# Patient Record
Sex: Female | Born: 2000 | Race: Black or African American | Hispanic: No | Marital: Single | Smoking: Never smoker
Health system: Southern US, Community
[De-identification: ages and names within clinical notes are randomized; demographics above are authoritative.]

---
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
## 2021-03-29 DIAGNOSIS — F419 Anxiety disorder, unspecified: Secondary | ICD-10-CM | POA: Diagnosis not present

## 2021-12-06 DIAGNOSIS — M25562 Pain in left knee: Secondary | ICD-10-CM | POA: Diagnosis not present

## 2022-03-10 DIAGNOSIS — J0110 Acute frontal sinusitis, unspecified: Secondary | ICD-10-CM | POA: Diagnosis not present

## 2022-03-26 ENCOUNTER — Ambulatory Visit (HOSPITAL_COMMUNITY)
Admission: EM | Admit: 2022-03-26 | Discharge: 2022-03-26 | Disposition: A | Discharge status: Home / Self Care | Payer: BC Managed Care – PPO | Attending: Internal Medicine | Admitting: Internal Medicine

## 2022-03-26 ENCOUNTER — Encounter (HOSPITAL_COMMUNITY): Payer: Self-pay

## 2022-03-26 DIAGNOSIS — J069 Acute upper respiratory infection, unspecified: Secondary | ICD-10-CM

## 2022-03-26 DIAGNOSIS — Z1152 Encounter for screening for COVID-19: Secondary | ICD-10-CM | POA: Diagnosis not present

## 2022-03-26 DIAGNOSIS — J029 Acute pharyngitis, unspecified: Secondary | ICD-10-CM

## 2022-03-26 LAB — POCT RAPID STREP A, ED / UC: Streptococcus, Group A Screen (Direct): NEGATIVE

## 2022-03-26 NOTE — Discharge Instructions (Signed)
COVID/flu test will be completed in 24-48 hours.  If you do not get a call from this office within that timeframe it indicates the test is negative.  Log onto MyChart to review the results of the test when it post in 24-48 hours. Advised to continue using OTC cough preparations for the congestion and cough. Advise use Tylenol and ibuprofen for aches or pains. Advised to follow-up PCP or return to urgent care if symptoms fail to improve.

## 2022-03-26 NOTE — ED Triage Notes (Signed)
Cough, fever sore throat for 5 days. Patient's mom was sick first. No covid or flu testing.

## 2022-03-26 NOTE — ED Provider Notes (Signed)
[facility]    CSN: [id number] Arrival date & time: 03/26/22  1625      History   Chief Complaint Chief Complaint  Patient presents with   Sore Throat   Fever   Cough    HPI VARIE MACHAMER is a 21 y.o. female.   21 year old female presents with cough congestion.  Patient indicates for the past 5 days that she has been having progressive sore throat and painful swallowing.  She indicates that she has had upper respiratory congestion with rhinitis and postnasal drip and mainly clear production.  She does indicates she just finished a course of amoxicillin for a sinus infection.  She indicates she is having some chest congestion and cough with clear to yellow-greenish production.  She denies having fever or chills.  She indicates she was around her 2 nieces who are having similar type symptoms, the older niece had a negative flu/COVID/RSV test.  Patient relates that she is tolerating fluids well and that she is not having any fever, chills, body aches or pains, nausea or vomiting.   Sore Throat  Fever Associated symptoms: cough   Cough Associated symptoms: fever     History reviewed. No pertinent past medical history.  There are no problems to display for this patient.   History reviewed. No pertinent surgical history.  OB History   No obstetric history on file.      Home Medications    Prior to Admission medications   Not on File    Family History History reviewed. No pertinent family history.  Social History Social History   Tobacco Use   Smoking status: Never   Smokeless tobacco: Never  Vaping Use   Vaping Use: Never used  Substance Use Topics   Alcohol use: Yes   Drug use: Never     Allergies   Tylenol [acetaminophen]   Review of Systems Review of Systems  Constitutional:  Positive for fever.  Respiratory:  Positive for cough.      Physical Exam Triage Vital Signs ED Triage Vitals  Enc Vitals Group     BP 03/26/22 1645  119/79     Pulse Rate 03/26/22 1645 82     Resp 03/26/22 1645 18     Temp 03/26/22 1645 98.9 F (37.2 C)     Temp Source 03/26/22 1645 Oral     SpO2 03/26/22 1645 98 %     Weight --      Height --      Head Circumference --      Peak Flow --      Pain Score 03/26/22 1640 7     Pain Loc --      Pain Edu? --      Excl. in [place]? --    No data found.  Updated Vital Signs BP 119/79 (BP Location: Left Arm)   Pulse 82   Temp 98.9 F (37.2 C) (Oral)   Resp 18   LMP 02/11/2022 (Approximate)   SpO2 98%   Visual Acuity Right Eye Distance:   Left Eye Distance:   Bilateral Distance:    Right Eye Near:   Left Eye Near:    Bilateral Near:     Physical Exam Constitutional:      Appearance: She is well-developed.  HENT:     Right Ear: Tympanic membrane and ear canal normal.     Left Ear: Tympanic membrane and ear canal normal.     Mouth/Throat:     Mouth:  Mucous membranes are moist.     Pharynx: Posterior oropharyngeal erythema present. No oropharyngeal exudate.  Cardiovascular:     Rate and Rhythm: Normal rate and regular rhythm.     Heart sounds: Normal heart sounds.  Pulmonary:     Effort: Pulmonary effort is normal.     Breath sounds: Normal air entry. Decreased breath sounds present. No wheezing, rhonchi or rales.  Lymphadenopathy:     Cervical: No cervical adenopathy.  Neurological:     Mental Status: She is alert.      UC Treatments / Results  Labs (all labs ordered are listed, but only abnormal results are displayed) Labs Reviewed  RESP PANEL BY RT-PCR (FLU A&B, COVID) [id number]  CULTURE, GROUP A STREP [facility])  POCT RAPID STREP A, ED / UC    EKG   Radiology No results found.  Procedures Procedures (including critical care time)  Medications Ordered in UC Medications - No data to display  Initial Impression / Assessment and Plan / UC Course  I have reviewed the triage vital signs and the nursing notes.  Pertinent labs & imaging results that were  available during my care of the patient were reviewed by me and considered in my medical decision making (see chart for details).    Plan: 1.  The pharyngitis will be treated with the following: A.  Throat culture is pending. B.  Advised to use Tylenol or ibuprofen for discomfort along with salt water water gargles and lozenges. 2.  The upper respiratory tract infection will be treated with the following: A.  Patient advised to use OTC respiratory cough preparations to control symptoms as this is a viral process and will likely resolve within another 3 to 5 days. 3.  COVID screening will be treated with the following: A.  COVID test is pending due to upper respiratory symptoms with cough, congestion, and sore throat to ensure that COVID is not present. 4.  Patient advised to follow-up PCP or return to urgent care if symptoms fail to improve. Final Clinical Impressions(s) / UC Diagnoses   Final diagnoses:  Viral upper respiratory tract infection  Pharyngitis, unspecified etiology  Encounter for screening for COVID-19     Discharge Instructions      COVID/flu test will be completed in 24-48 hours.  If you do not get a call from this office within that timeframe it indicates the test is negative.  Log onto MyChart to review the results of the test when it post in 24-48 hours. Advised to continue using OTC cough preparations for the congestion and cough. Advise use Tylenol and ibuprofen for aches or pains. Advised to follow-up PCP or return to urgent care if symptoms fail to improve.    ED Prescriptions   None    PDMP not reviewed this encounter.   Ellsworth Lennox, PA-C 03/26/22 1732

## 2022-03-27 LAB — RESP PANEL BY RT-PCR (FLU A&B, COVID) ARPGX2
Influenza A by PCR: NEGATIVE
Influenza B by PCR: NEGATIVE
SARS Coronavirus 2 by RT PCR: NEGATIVE

## 2022-03-29 LAB — CULTURE, GROUP A STREP (THRC)

## 2022-06-20 ENCOUNTER — Ambulatory Visit
Admission: EM | Admit: 2022-06-20 | Discharge: 2022-06-20 | Disposition: A | Discharge status: Home / Self Care | Payer: BC Managed Care – PPO | Attending: Internal Medicine | Admitting: Internal Medicine

## 2022-06-20 DIAGNOSIS — Z1152 Encounter for screening for COVID-19: Secondary | ICD-10-CM | POA: Insufficient documentation

## 2022-06-20 DIAGNOSIS — R058 Other specified cough: Secondary | ICD-10-CM | POA: Diagnosis not present

## 2022-06-20 DIAGNOSIS — R051 Acute cough: Secondary | ICD-10-CM | POA: Diagnosis present

## 2022-06-20 DIAGNOSIS — B349 Viral infection, unspecified: Secondary | ICD-10-CM | POA: Diagnosis present

## 2022-06-20 DIAGNOSIS — R0989 Other specified symptoms and signs involving the circulatory and respiratory systems: Secondary | ICD-10-CM | POA: Diagnosis not present

## 2022-06-20 DIAGNOSIS — R42 Dizziness and giddiness: Secondary | ICD-10-CM | POA: Diagnosis not present

## 2022-06-20 DIAGNOSIS — H6593 Unspecified nonsuppurative otitis media, bilateral: Secondary | ICD-10-CM

## 2022-06-20 LAB — POCT FASTING CBG KUC MANUAL ENTRY: POCT Glucose (KUC): 99 mg/dL (ref 70–99)

## 2022-06-20 MED ORDER — PREDNISONE 20 MG PO TABS: 40.0000 mg | ORAL_TABLET | Freq: Every day | ORAL | 0 refills | Status: AC

## 2022-06-20 NOTE — ED Triage Notes (Signed)
Pt c/o temporary dizziness when waking up this morning. States throughout the day the dizziness has been getting worse and she cannot walk straight, she runs into walls. Also c/o cough.

## 2022-06-20 NOTE — Discharge Instructions (Signed)
You have fluid behind your eardrums which is most likely causing your dizziness.  I have prescribed prednisone to help alleviate this and cough.  You most likely have a viral illness causing the symptoms.  COVID test is pending.  Will call if it is positive.  I recommend that you go straight to the emergency department if your dizziness does not improve or if it worsens in the next 24 to 48 hours.

## 2022-06-20 NOTE — ED Provider Notes (Signed)
[facility]    CSN: [id number] Arrival date & time: 06/20/22  1740      History   Chief Complaint Chief Complaint  Patient presents with   Dizziness    HPI Brandi Peterson is a 22 y.o. female.   Patient presents with dizziness, cough, runny nose that started today.  Cough is nonproductive per patient.  She denies any known fevers at home.  Denies chest pain, shortness of breath, palpitations, blurred vision, headache, nausea, vomiting, diarrhea, abdominal pain.  Reports known sick contact with similar symptoms.  She does not report taking medications to help alleviate pain.  Denies history of asthma.  Denies any recent falls or head trauma.   Dizziness   History reviewed. No pertinent past medical history.  There are no problems to display for this patient.   History reviewed. No pertinent surgical history.  OB History   No obstetric history on file.      Home Medications    Prior to Admission medications   Medication Sig Start Date End Date Taking? Authorizing Provider  predniSONE (DELTASONE) 20 MG tablet Take 2 tablets (40 mg total) by mouth daily for 5 days. 06/20/22 06/25/22 Yes Gustavus Bryant, FNP    Family History History reviewed. No pertinent family history.  Social History Social History   Tobacco Use   Smoking status: Never   Smokeless tobacco: Never  Vaping Use   Vaping Use: Never used  Substance Use Topics   Alcohol use: Yes   Drug use: Never     Allergies   Tylenol [acetaminophen]   Review of Systems Review of Systems Per HPI  Physical Exam Triage Vital Signs ED Triage Vitals  Enc Vitals Group     BP 06/20/22 1755 113/69     Pulse Rate 06/20/22 1754 (!) 105     Resp 06/20/22 1754 16     Temp 06/20/22 1754 99.9 F (37.7 C)     Temp Source 06/20/22 1754 Oral     SpO2 06/20/22 1754 98 %     Weight --      Height --      Head Circumference --      Peak Flow --      Pain Score 06/20/22 1754 0     Pain Loc --       Pain Edu? --      Excl. in GC? --    No data found.  Updated Vital Signs BP 113/69 (BP Location: Left Arm)   Pulse (!) 105   Temp 99.9 F (37.7 C) (Oral)   Resp 16   SpO2 98%   Visual Acuity Right Eye Distance:   Left Eye Distance:   Bilateral Distance:    Right Eye Near:   Left Eye Near:    Bilateral Near:     Physical Exam Constitutional:      General: She is not in acute distress.    Appearance: Normal appearance. She is not toxic-appearing or diaphoretic.  HENT:     Head: Normocephalic and atraumatic.     Right Ear: Ear canal normal. No drainage, swelling or tenderness. A middle ear effusion is present. Tympanic membrane is not scarred, perforated or bulging.     Left Ear: Ear canal normal. No drainage, swelling or tenderness. A middle ear effusion is present. Tympanic membrane is not scarred, perforated or bulging.     Nose: Congestion present.     Mouth/Throat:     Mouth: Mucous membranes are  moist.     Pharynx: No posterior oropharyngeal erythema.  Eyes:     Extraocular Movements: Extraocular movements intact.     Conjunctiva/sclera: Conjunctivae normal.     Pupils: Pupils are equal, round, and reactive to light.  Cardiovascular:     Rate and Rhythm: Normal rate and regular rhythm.     Pulses: Normal pulses.     Heart sounds: Normal heart sounds.  Pulmonary:     Effort: Pulmonary effort is normal. No respiratory distress.     Breath sounds: Normal breath sounds. No wheezing.  Abdominal:     General: Abdomen is flat. Bowel sounds are normal.     Palpations: Abdomen is soft.  Musculoskeletal:        General: Normal range of motion.     Cervical back: Normal range of motion.  Skin:    General: Skin is warm and dry.  Neurological:     General: No focal deficit present.     Mental Status: She is alert and oriented to person, place, and time. Mental status is at baseline.     Cranial Nerves: Cranial nerves 2-12 are intact.     Sensory: Sensation is intact.      Motor: Motor function is intact.     Coordination: Coordination is intact.     Gait: Gait is intact.  Psychiatric:        Mood and Affect: Mood normal.        Behavior: Behavior normal.      UC Treatments / Results  Labs (all labs ordered are listed, but only abnormal results are displayed) Labs Reviewed  SARS CORONAVIRUS 2 (TAT 6-24 HRS)  POCT FASTING [facility] [place] ENTRY    EKG   Radiology No results found.  Procedures Procedures (including critical care time)  Medications Ordered in UC Medications - No data to display  Initial Impression / Assessment and Plan / UC Course  I have reviewed the triage vital signs and the nursing notes.  Pertinent labs & imaging results that were available during my care of the patient were reviewed by me and considered in my medical decision making (see chart for details).     Patient presents with symptoms likely from a viral upper respiratory infection. Differential includes bacterial pneumonia, sinusitis, allergic rhinitis, COVID-19, flu, RSV. Do not suspect underlying cardiopulmonary process. Symptoms seem unlikely related to ACS, CHF or COPD exacerbations, pneumonia, pneumothorax. Patient is nontoxic appearing and not in need of emergent medical intervention.  COVID test pending.  Blood sugar completed that was unremarkable.  Suspect patient's dizziness is due to significant fluid behind TMs.  Therefore, do not think that any additional workup for dizziness is necessary at this time.  Patient is mildly tachycardic but also has low-grade temperature which is most likely related to this.  Attempted to give antipyretic but patient reports that she is allergic to Tylenol and NSAIDs so this was deferred.  Heart rate is only mildly elevated so do not think that any further workup is necessary for this as it is most likely related to low-grade temp.  Do not think it is related to dizziness given significant fluid behind TMs on  exam.  Recommended symptom control with medications and supportive care.  Given amount of fluid behind TMs on physical exam, will treat with prednisone steroid.  Patient reports she has taken this before and tolerated well.  No obvious contraindication to steroid therapy noted in patient's history.  Return if symptoms fail to improve.  Patient was advised to go to the ER if no improvement in dizziness in the next 24 to 48 hours or if it worsens.  Patient states understanding and is agreeable.  Discharged with PCP followup.  Final Clinical Impressions(s) / UC Diagnoses   Final diagnoses:  Fluid level behind tympanic membrane of both ears  Viral illness  Acute cough  Dizziness and giddiness     Discharge Instructions      You have fluid behind your eardrums which is most likely causing your dizziness.  I have prescribed prednisone to help alleviate this and cough.  You most likely have a viral illness causing the symptoms.  COVID test is pending.  Will call if it is positive.  I recommend that you go straight to the emergency department if your dizziness does not improve or if it worsens in the next 24 to 48 hours.    ED Prescriptions     Medication Sig Dispense Auth. Provider   predniSONE (DELTASONE) 20 MG tablet Take 2 tablets (40 mg total) by mouth daily for 5 days. 10 tablet Gustavus Bryant, [place]      PDMP not reviewed this encounter.   Gustavus Bryant, [place] 06/21/22 [phone]

## 2022-06-22 LAB — SARS CORONAVIRUS 2 (TAT 6-24 HRS): SARS Coronavirus 2: NEGATIVE

## 2022-07-18 ENCOUNTER — Encounter (HOSPITAL_COMMUNITY): Payer: Self-pay | Admitting: *Deleted

## 2022-07-18 ENCOUNTER — Emergency Department (HOSPITAL_COMMUNITY)
Admission: EM | Admit: 2022-07-18 | Discharge: 2022-07-18 | Disposition: A | Discharge status: Home / Self Care | Payer: BC Managed Care – PPO | Attending: Emergency Medicine | Admitting: Emergency Medicine

## 2022-07-18 ENCOUNTER — Other Ambulatory Visit: Payer: Self-pay

## 2022-07-18 DIAGNOSIS — R197 Diarrhea, unspecified: Secondary | ICD-10-CM | POA: Diagnosis not present

## 2022-07-18 DIAGNOSIS — R112 Nausea with vomiting, unspecified: Secondary | ICD-10-CM

## 2022-07-18 LAB — COMPREHENSIVE METABOLIC PANEL
ALT: 11 U/L (ref 0–44)
AST: 18 U/L (ref 15–41)
Albumin: 4.3 g/dL (ref 3.5–5.0)
Alkaline Phosphatase: 78 U/L (ref 38–126)
Anion gap: 11 (ref 5–15)
BUN: 11 mg/dL (ref 6–20)
CO2: 18 mmol/L — ABNORMAL LOW (ref 22–32)
Calcium: 9.4 mg/dL (ref 8.9–10.3)
Chloride: 107 mmol/L (ref 98–111)
Creatinine, Ser: 0.78 mg/dL (ref 0.44–1.00)
GFR, Estimated: >60 mL/min (ref >60)
Glucose, Bld: 126 mg/dL — ABNORMAL HIGH (ref 70–99)
Potassium: 3.9 mmol/L (ref 3.5–5.1)
Sodium: 136 mmol/L (ref 135–145)
Total Bilirubin: 1.1 mg/dL (ref 0.3–1.2)
Total Protein: 7.5 g/dL (ref 6.5–8.1)

## 2022-07-18 LAB — CBC
HCT: 41.7 % (ref 36.0–46.0)
Hemoglobin: 13.6 g/dL (ref 12.0–15.0)
MCH: 28.2 pg (ref 26.0–34.0)
MCHC: 32.6 g/dL (ref 30.0–36.0)
MCV: 86.3 fL (ref 80.0–100.0)
Platelets: 315 10*3/uL (ref 150–400)
RBC: 4.83 MIL/uL (ref 3.87–5.11)
RDW: 13.5 % (ref 11.5–15.5)
WBC: 10.1 10*3/uL (ref 4.0–10.5)
nRBC: 0 % (ref 0.0–0.2)

## 2022-07-18 LAB — URINALYSIS, ROUTINE W REFLEX MICROSCOPIC
Bilirubin Urine: NEGATIVE
Glucose, UA: NEGATIVE mg/dL
Hgb urine dipstick: NEGATIVE
Ketones, ur: 80 mg/dL — AB
Leukocytes,Ua: NEGATIVE
Nitrite: NEGATIVE
Protein, ur: NEGATIVE mg/dL
Specific Gravity, Urine: 1.026 (ref 1.005–1.030)
pH: 5 (ref 5.0–8.0)

## 2022-07-18 LAB — I-STAT BETA HCG BLOOD, ED (MC, WL, AP ONLY): I-stat hCG, quantitative: <5 m[IU]/mL (ref <5)

## 2022-07-18 LAB — LIPASE, BLOOD: Lipase: 27 U/L (ref 11–51)

## 2022-07-18 MED ORDER — ONDANSETRON HCL 4 MG/2ML IJ SOLN
4.0000 mg | Freq: Once | INTRAMUSCULAR | Status: AC
  Administered 2022-07-18: 4 mg via INTRAVENOUS
  Filled 2022-07-18: qty 2

## 2022-07-18 MED ORDER — SODIUM CHLORIDE 0.9 % IV BOLUS
1000.0000 mL | Freq: Once | INTRAVENOUS | Status: AC
  Administered 2022-07-18: 1000 mL via INTRAVENOUS

## 2022-07-18 MED ORDER — KETOROLAC TROMETHAMINE 15 MG/ML IJ SOLN
15.0000 mg | Freq: Once | INTRAMUSCULAR | Status: AC
  Administered 2022-07-18: 15 mg via INTRAVENOUS
  Filled 2022-07-18: qty 1

## 2022-07-18 MED ORDER — ONDANSETRON 4 MG PO TBDP: 4.0000 mg | ORAL_TABLET | Freq: Three times a day (TID) | ORAL | 0 refills | Status: DC | PRN

## 2022-07-18 NOTE — Discharge Instructions (Addendum)
As we discussed, your workup in the ER today was reassuring for acute findings.  Laboratory evaluation did not reveal any emergent concerns.  I have given you a prescription for Zofran which is a nausea medication for you to take as prescribed as needed for any residual symptoms.  Follow-up with your primary care doctor as needed.  Return if development of any new or worsening symptoms.  Return if development of any new or worsening symptoms.

## 2022-07-18 NOTE — ED Notes (Signed)
Pt verbalizes understanding of discharge instructions. Opportunity for questions and answers were provided. Pt discharged from the ED.   ?

## 2022-07-18 NOTE — ED Provider Notes (Signed)
[facility] Provider Note   CSN: [id number] Arrival date & time: 07/18/22  1000     History  Chief Complaint  Patient presents with   Emesis    Brandi Peterson is a 22 y.o. female.  Patient with no pertinent past medical history presents today with complaints of nausea, vomiting, and diarrhea.  She states that her symptoms began last night and persisted through the night into today as well.  She states that she has had substantial amount of vomiting and diarrhea.  No known sick contacts.  Denies fevers or chills.  Does endorse some abdominal pain that is generalized throughout her abdomen.  Denies any history of abdominal surgeries or similar symptoms previously.  The history is provided by the patient. No language interpreter was used.  Emesis Associated symptoms: diarrhea        Home Medications Prior to Admission medications   Not on File      Allergies    Tylenol [acetaminophen]    Review of Systems   Review of Systems  Gastrointestinal:  Positive for diarrhea, nausea and vomiting.  All other systems reviewed and are negative.   Physical Exam Updated Vital Signs BP 124/82   Pulse (!) 135   Temp 98.5 F (36.9 C)   Resp 18   Ht 5\' 5"  (1.651 m)   Wt 90.7 kg   LMP 05/16/2022   SpO2 100%   BMI 33.28 kg/m  Physical Exam Vitals and nursing note reviewed.  Constitutional:      General: She is not in acute distress.    Appearance: Normal appearance. She is normal weight. She is not ill-appearing, toxic-appearing or diaphoretic.  HENT:     Head: Normocephalic and atraumatic.  Cardiovascular:     Rate and Rhythm: Normal rate.  Pulmonary:     Effort: Pulmonary effort is normal. No respiratory distress.  Abdominal:     General: Abdomen is flat.     Palpations: Abdomen is soft.     Tenderness: There is no abdominal tenderness.  Musculoskeletal:        General: Normal range of motion.     Cervical back: Normal  range of motion.  Skin:    General: Skin is warm and dry.  Neurological:     General: No focal deficit present.     Mental Status: She is alert.  Psychiatric:        Mood and Affect: Mood normal.        Behavior: Behavior normal.     ED Results / Procedures / Treatments   Labs (all labs ordered are listed, but only abnormal results are displayed) Labs Reviewed  COMPREHENSIVE METABOLIC PANEL - Abnormal; Notable for the following components:      Result Value   CO2 18 (*)    Glucose, Bld 126 (*)    All other components within normal limits  URINALYSIS, ROUTINE W REFLEX MICROSCOPIC - Abnormal; Notable for the following components:   Ketones, ur 80 (*)    All other components within normal limits  LIPASE, BLOOD  CBC  I-STAT BETA HCG BLOOD, ED ([facility], [facility], [facility] ONLY)    EKG None  Radiology No results found.  Procedures Procedures    Medications Ordered in ED Medications  sodium chloride 0.9 % bolus 1,000 mL (1,000 mLs Intravenous New Bag/Given 07/18/22 1221)  ondansetron (ZOFRAN) injection 4 mg (4 mg Intravenous Given 07/18/22 1222)  ketorolac (TORADOL) 15 MG/ML injection 15 mg (15  mg Intravenous Given 07/18/22 1223)    ED Course/ Medical Decision Making/ A&P                             Medical Decision Making Amount and/or Complexity of Data Reviewed Labs: ordered.  Risk Prescription drug management.   This patient is a 22 y.o. female who presents to the ED for concern of nausea, vomiting, and diarrhea, this involves an extensive number of treatment options, and is a complaint that carries with it a high risk of complications and morbidity. The emergent differential diagnosis prior to evaluation includes, but is not limited to,  AAA, gastroenteritis, appendicitis, Bowel obstruction, Bowel perforation. Gastroparesis, DKA, Hernia, Inflammatory bowel disease, mesenteric ischemia, pancreatitis, peritonitis SBP, volvulus.   This is not an exhaustive differential.   Past  Medical History / Co-morbidities / Social History: N/A   Physical Exam: Physical exam performed. The pertinent findings include: abdomen soft and non-tender. On my initial assessment, patient drinking ginger ale without any active vomiting  Lab Tests: I ordered, and personally interpreted labs.  The pertinent results include:  UA with ketones, likely due to dehydration, no other acute laboratory findings    Medications: I ordered medication including Zofran, Toradol, and fluids for pain, nausea, and vomiting. Reevaluation of the patient after these medicines showed that the patient improved. I have reviewed the patients home medicines and have made adjustments as needed.    Disposition:  Patient presents today with abdominal pain since last night.  Patient is nontoxic, nonseptic appearing, in no apparent distress.  Patient's pain and other symptoms adequately managed in emergency department.  Fluid bolus given.  Labs and vitals reviewed.  Patient does not meet the SIRS or Sepsis criteria.  Patient's abdomen is also soft and nontender.  No indication of appendicitis, bowel obstruction, bowel perforation, cholecystitis, diverticulitis, PID or ectopic pregnancy.  Patient's symptoms likely due to gastroenteritis, likely viral etiology.  Patient is able to tolerate p.o. intake without any subsequent episodes of nausea or vomiting.  She is feeling better after medication management.  Discussed further evaluation with CT imaging, shared decision making implemented and will defer imaging at this time as her symptoms have substantially improved.  Patient discharged home with Zofran for symptomatic treatment and given strict instructions for follow-up with their primary care physician.  I have also discussed reasons to return immediately to the ER.  Patient expresses understanding and agrees with plan.  Patient discharged in stable condition.   Final Clinical Impression(s) / ED Diagnoses Final diagnoses:   Nausea vomiting and diarrhea    Rx / DC Orders ED Discharge Orders          Ordered    ondansetron (ZOFRAN-ODT) 4 MG disintegrating tablet  Every 8 hours PRN        07/18/22 1310          An After Visit Summary was printed and given to the patient.     Bud Face, PA-C 07/18/22 1327    Malvin Johns, MD 07/18/22 1600

## 2022-07-18 NOTE — ED Triage Notes (Signed)
States she became nauseated after supper last pm , states she was nauseated and vomiting all night. C/o abd. Pain , c/;o diarrhea.

## 2023-07-06 ENCOUNTER — Ambulatory Visit (HOSPITAL_COMMUNITY)
Admission: EM | Admit: 2023-07-06 | Discharge: 2023-07-06 | Disposition: A | Discharge status: Home / Self Care | Payer: BC Managed Care – PPO | Attending: Family Medicine | Admitting: Family Medicine

## 2023-07-06 ENCOUNTER — Encounter (HOSPITAL_COMMUNITY): Payer: Self-pay

## 2023-07-06 DIAGNOSIS — B2790 Infectious mononucleosis, unspecified without complication: Secondary | ICD-10-CM | POA: Insufficient documentation

## 2023-07-06 DIAGNOSIS — R059 Cough, unspecified: Secondary | ICD-10-CM | POA: Diagnosis present

## 2023-07-06 DIAGNOSIS — Z202 Contact with and (suspected) exposure to infections with a predominantly sexual mode of transmission: Secondary | ICD-10-CM | POA: Insufficient documentation

## 2023-07-06 DIAGNOSIS — J029 Acute pharyngitis, unspecified: Secondary | ICD-10-CM | POA: Insufficient documentation

## 2023-07-06 DIAGNOSIS — R519 Headache, unspecified: Secondary | ICD-10-CM | POA: Diagnosis present

## 2023-07-06 LAB — POCT RAPID STREP A (OFFICE): Rapid Strep A Screen: NEGATIVE

## 2023-07-06 LAB — POCT MONO SCREEN (KUC): Mono, POC: POSITIVE — AB

## 2023-07-06 MED ORDER — LIDOCAINE VISCOUS HCL 2 % MT SOLN: 15.0000 mL | OROMUCOSAL | 0 refills | Status: AC | PRN

## 2023-07-06 NOTE — ED Provider Notes (Signed)
[facility]    CSN: [id number] Arrival date & time: 07/06/23  1454      History   Chief Complaint Chief Complaint  Patient presents with   Sore Throat    HPI Brandi Peterson is a 23 y.o. female.   Patient presents with sore throat, congestion, headache, mild cough, and mild nausea x 3 days.  Patient endorses difficulty swallowing due to sore throat.  Denies known trouble breathing, fever, body aches, abdominal pain, vomiting, and diarrhea.   Sore Throat Associated symptoms include headaches. Pertinent negatives include no chest pain, no abdominal pain and no shortness of breath.    History reviewed. No pertinent past medical history.  There are no active problems to display for this patient.   History reviewed. No pertinent surgical history.  OB History   No obstetric history on file.      Home Medications    Prior to Admission medications   Medication Sig Start Date End Date Taking? Authorizing Provider  lidocaine (XYLOCAINE) 2 % solution Use as directed 15 mLs in the mouth or throat as needed for mouth pain. 07/06/23  Yes Wynonia Lawman A, NP  MICROGESTIN FE 1/20 1-20 MG-[facility] tablet Take 1 tablet by mouth daily. 07/04/23  Yes [provider]  ondansetron (ZOFRAN-ODT) 4 MG disintegrating tablet Take 1 tablet (4 mg total) by mouth every 8 (eight) hours as needed for nausea or vomiting. 07/18/22  Yes Smoot, Shawn Route, PA-C    Family History History reviewed. No pertinent family history.  Social History Social History   Tobacco Use   Smoking status: Never   Smokeless tobacco: Never  Vaping Use   Vaping status: Never Used  Substance Use Topics   Alcohol use: Yes   Drug use: Never     Allergies   Tylenol [acetaminophen]   Review of Systems Review of Systems  Constitutional:  Positive for chills and fatigue. Negative for fever.  HENT:  Positive for congestion and sore throat.   Respiratory:  Positive for cough. Negative for chest  tightness and shortness of breath.   Cardiovascular:  Negative for chest pain.  Gastrointestinal:  Positive for nausea. Negative for abdominal pain, diarrhea and vomiting.  Neurological:  Positive for headaches. Negative for dizziness, weakness and light-headedness.     Physical Exam Triage Vital Signs ED Triage Vitals [07/06/23 1551]  Encounter Vitals Group     BP 115/78     Systolic BP Percentile      Diastolic BP Percentile      Pulse Rate 76     Resp 18     Temp 99.7 F (37.6 C)     Temp Source Oral     SpO2 99 %     Weight      Height      Head Circumference      Peak Flow      Pain Score      Pain Loc      Pain Education      Exclude from Growth Chart    No data found.  Updated Vital Signs BP 115/78 (BP Location: Left Arm)   Pulse 76   Temp 99.7 F (37.6 C) (Oral)   Resp 18   LMP 06/28/2023 (Approximate)   SpO2 99%   Visual Acuity Right Eye Distance:   Left Eye Distance:   Bilateral Distance:    Right Eye Near:   Left Eye Near:    Bilateral Near:     Physical Exam  Vitals and nursing note reviewed.  Constitutional:      General: She is awake. She is not in acute distress.    Appearance: Normal appearance. She is well-developed and well-groomed. She is not ill-appearing.  HENT:     Right Ear: Tympanic membrane and ear canal normal.     Left Ear: Tympanic membrane and ear canal normal.     Nose: Congestion and rhinorrhea present.     Mouth/Throat:     Mouth: Mucous membranes are moist.     Pharynx: Pharyngeal swelling, oropharyngeal exudate and posterior oropharyngeal erythema present.     Tonsils: Tonsillar exudate present.  Cardiovascular:     Rate and Rhythm: Normal rate and regular rhythm.  Pulmonary:     Effort: Pulmonary effort is normal.     Breath sounds: Normal breath sounds.  Skin:    General: Skin is warm and dry.  Neurological:     Mental Status: She is alert.  Psychiatric:        Behavior: Behavior is cooperative.      UC  Treatments / Results  Labs (all labs ordered are listed, but only abnormal results are displayed) Labs Reviewed  POCT MONO SCREEN ([facility]) - Abnormal; Notable for the following components:      Result Value   Mono, POC Positive (*)    All other components within normal limits  POCT RAPID STREP A (OFFICE) - Normal  CYTOLOGY, (ORAL, ANAL, URETHRAL) ANCILLARY ONLY    EKG   Radiology No results found.  Procedures Procedures (including critical care time)  Medications Ordered in UC Medications - No data to display  Initial Impression / Assessment and Plan / UC Course  I have reviewed the triage vital signs and the nursing notes.  Pertinent labs & imaging results that were available during my care of the patient were reviewed by me and considered in my medical decision making (see chart for details).     Patient presented with 3-day history of sore throat, congestion, intermittent mild headache, mild cough, and mild nausea.  Endorses difficulty swallowing due to sore throat.  Denies any other symptoms.  Upon assessment patient has erythema, exudate, moderate swelling noted to pharynx.  Tonsillar exudate and moderate swelling noted to tonsils.  Rapid strep was negative, will send culture.  Patient reports recent unprotected oral sexual intercourse and wants to make sure she does not have a STD.  STD swab performed.  POC mono testing was positive in clinic.  Discussed over-the-counter medications for symptoms.  Prescribed lidocaine as needed for sore throat.  Discussed follow-up, return, and ER precautions. Final Clinical Impressions(s) / UC Diagnoses   Final diagnoses:  Pharyngitis, unspecified etiology  Sore throat  Infectious mononucleosis without complication, infectious mononucleosis due to unspecified organism     Discharge Instructions      You can use lidocaine as needed for sore throat.  Gargle and spit this, do not swallow.  Otherwise you can take ibuprofen as needed  for pain and fever.  Stay hydrated and get plenty of rest.  Return here as needed.  If you develop high fevers unrelieved by medication, severe abdominal pain or vomiting please seek medical treatment in the ER.     ED Prescriptions     Medication Sig Dispense Auth. Provider   lidocaine (XYLOCAINE) 2 % solution Use as directed 15 mLs in the mouth or throat as needed for mouth pain. 100 mL Wynonia Lawman A, NP      PDMP not reviewed  this encounter.   Wynonia Lawman A, NP 07/06/23 1659

## 2023-07-06 NOTE — Discharge Instructions (Addendum)
You can use lidocaine as needed for sore throat.  Gargle and spit this, do not swallow.  Otherwise you can take ibuprofen as needed for pain and fever.  Stay hydrated and get plenty of rest.  Return here as needed.  If you develop high fevers unrelieved by medication, severe abdominal pain or vomiting please seek medical treatment in the ER.

## 2023-07-07 ENCOUNTER — Telehealth (HOSPITAL_COMMUNITY): Payer: Self-pay | Admitting: Radiology

## 2023-07-07 LAB — CYTOLOGY, (ORAL, ANAL, URETHRAL) ANCILLARY ONLY
Chlamydia: NEGATIVE
Comment: NEGATIVE
Comment: NORMAL
Neisseria Gonorrhea: NEGATIVE

## 2023-07-07 NOTE — Telephone Encounter (Signed)
I received a call from the main lab about a patient's swab from 07/06/23 . Lab states the swab was an oral swab and wanted to let us know that they no longer check for Trich with the oral swabs. They said they will still run the swab for all the other test ordered. Ordering provider has been made aware

## 2023-09-13 ENCOUNTER — Other Ambulatory Visit: Payer: Self-pay

## 2023-09-13 ENCOUNTER — Emergency Department (HOSPITAL_COMMUNITY)
Admission: EM | Admit: 2023-09-13 | Discharge: 2023-09-13 | Disposition: A | Discharge status: Home / Self Care | Attending: Emergency Medicine | Admitting: Emergency Medicine

## 2023-09-13 ENCOUNTER — Encounter (HOSPITAL_COMMUNITY): Payer: Self-pay

## 2023-09-13 DIAGNOSIS — R197 Diarrhea, unspecified: Secondary | ICD-10-CM | POA: Diagnosis not present

## 2023-09-13 DIAGNOSIS — R112 Nausea with vomiting, unspecified: Secondary | ICD-10-CM | POA: Insufficient documentation

## 2023-09-13 DIAGNOSIS — R109 Unspecified abdominal pain: Secondary | ICD-10-CM | POA: Diagnosis present

## 2023-09-13 LAB — COMPREHENSIVE METABOLIC PANEL WITH GFR
ALT: 15 U/L (ref 0–44)
AST: 19 U/L (ref 15–41)
Albumin: 3.9 g/dL (ref 3.5–5.0)
Alkaline Phosphatase: 77 U/L (ref 38–126)
Anion gap: 10 (ref 5–15)
BUN: 8 mg/dL (ref 6–20)
CO2: 20 mmol/L — ABNORMAL LOW (ref 22–32)
Calcium: 9.4 mg/dL (ref 8.9–10.3)
Chloride: 107 mmol/L (ref 98–111)
Creatinine, Ser: 0.73 mg/dL (ref 0.44–1.00)
GFR, Estimated: >60 mL/min (ref >60)
Glucose, Bld: 101 mg/dL — ABNORMAL HIGH (ref 70–99)
Potassium: 4 mmol/L (ref 3.5–5.1)
Sodium: 137 mmol/L (ref 135–145)
Total Bilirubin: 1.2 mg/dL (ref 0.0–1.2)
Total Protein: 7.5 g/dL (ref 6.5–8.1)

## 2023-09-13 LAB — HCG, SERUM, QUALITATIVE: Preg, Serum: NEGATIVE

## 2023-09-13 LAB — CBC
HCT: 41.4 % (ref 36.0–46.0)
Hemoglobin: 13.3 g/dL (ref 12.0–15.0)
MCH: 26.2 pg (ref 26.0–34.0)
MCHC: 32.1 g/dL (ref 30.0–36.0)
MCV: 81.5 fL (ref 80.0–100.0)
Platelets: 324 10*3/uL (ref 150–400)
RBC: 5.08 MIL/uL (ref 3.87–5.11)
RDW: 15.9 % — ABNORMAL HIGH (ref 11.5–15.5)
WBC: 8.5 10*3/uL (ref 4.0–10.5)
nRBC: 0 % (ref 0.0–0.2)

## 2023-09-13 LAB — RESP PANEL BY RT-PCR (RSV, FLU A&B, COVID)  RVPGX2
Influenza A by PCR: NEGATIVE
Influenza B by PCR: NEGATIVE
Resp Syncytial Virus by PCR: NEGATIVE
SARS Coronavirus 2 by RT PCR: NEGATIVE

## 2023-09-13 LAB — URINALYSIS, ROUTINE W REFLEX MICROSCOPIC
Bilirubin Urine: NEGATIVE
Glucose, UA: NEGATIVE mg/dL
Hgb urine dipstick: NEGATIVE
Ketones, ur: 80 mg/dL — AB
Leukocytes,Ua: NEGATIVE
Nitrite: NEGATIVE
Protein, ur: NEGATIVE mg/dL
Specific Gravity, Urine: 1.028 (ref 1.005–1.030)
pH: 6 (ref 5.0–8.0)

## 2023-09-13 LAB — LIPASE, BLOOD: Lipase: 26 U/L (ref 11–51)

## 2023-09-13 MED ORDER — ONDANSETRON HCL 4 MG/2ML IJ SOLN
4.0000 mg | Freq: Once | INTRAMUSCULAR | Status: AC
  Administered 2023-09-13: 4 mg via INTRAVENOUS
  Filled 2023-09-13: qty 2

## 2023-09-13 MED ORDER — ONDANSETRON 4 MG PO TBDP: 4.0000 mg | ORAL_TABLET | Freq: Three times a day (TID) | ORAL | 0 refills | Status: AC | PRN

## 2023-09-13 MED ORDER — LOPERAMIDE HCL 2 MG PO CAPS: 2.0000 mg | ORAL_CAPSULE | Freq: Four times a day (QID) | ORAL | 0 refills | Status: AC | PRN

## 2023-09-13 MED ORDER — DICYCLOMINE HCL 20 MG PO TABS: 20.0000 mg | ORAL_TABLET | Freq: Two times a day (BID) | ORAL | 0 refills | Status: AC

## 2023-09-13 MED ORDER — SODIUM CHLORIDE 0.9 % IV BOLUS
1000.0000 mL | Freq: Once | INTRAVENOUS | Status: AC
  Administered 2023-09-13: 1000 mL via INTRAVENOUS

## 2023-09-13 NOTE — ED Triage Notes (Signed)
 Pt c.o generalized abd pain, n/v/d since last night. Pt woke up around 4am vomiting. +chills, no fever, states her brother has the stomach bug.

## 2023-09-13 NOTE — Discharge Instructions (Signed)
 It was a pleasure to take care of you here in the emergency department.  You most likely have a viral gastroenteritis.  I am treating you with a few medications Zofran-this medications for nausea and vomiting Imodium-this medication is for diarrhea Bentyl-this medication is help with abdominal cramping  Make sure to slowly increase fluids at home, bland diet over the next few days  Return for new or worsening symptoms

## 2023-09-13 NOTE — ED Provider Notes (Signed)
 [place] EMERGENCY DEPARTMENT AT [facility] Provider Note   CSN: [id number] Arrival date & time: 09/13/23  1244    History  Chief Complaint  Patient presents with   Abdominal Pain   Emesis   Diarrhea    Brandi Peterson is a 23 y.o. female here for evaluation of nausea and vomiting diarrhea.  Started around 4 AM this morning.  Greater than 10 episodes of loose stool and emesis. No blood.  Is lightheaded, dehydrated.  Some chills and subjective fever.  Brother sick with similar symptoms. No syncope, dysuria, hematuria, recent antibiotics, travel.  No CP, back pain, vag dc. No meds PTA. Some gen abd tenderness. No Etoh use, Chronic NSAIDs, marijuana.      HPI     Home Medications Prior to Admission medications   Medication Sig Start Date End Date Taking? Authorizing Provider  dicyclomine (BENTYL) 20 MG tablet Take 1 tablet (20 mg total) by mouth 2 (two) times daily. 09/13/23  Yes Cormac Wint A, PA-C  loperamide (IMODIUM) 2 MG capsule Take 1 capsule (2 mg total) by mouth 4 (four) times daily as needed for diarrhea or loose stools. 09/13/23  Yes Faiga Stones A, PA-C  ondansetron (ZOFRAN-ODT) 4 MG disintegrating tablet Take 1 tablet (4 mg total) by mouth every 8 (eight) hours as needed. 09/13/23  Yes Katie Faraone A, PA-C  lidocaine (XYLOCAINE) 2 % solution Use as directed 15 mLs in the mouth or throat as needed for mouth pain. 07/06/23   Wynonia Lawman A, NP  MICROGESTIN FE 1/20 1-20 MG-MCG tablet Take 1 tablet by mouth daily. 07/04/23   [provider]      Allergies    Tylenol [acetaminophen], Coconut (cocos nucifera), Kiwi extract, and Peach [prunus persica]    Review of Systems   Review of Systems  Constitutional:  Positive for fatigue.  HENT: Negative.    Respiratory: Negative.    Cardiovascular: Negative.   Gastrointestinal:  Positive for abdominal pain, diarrhea, nausea and vomiting. Negative for abdominal distention, anal bleeding, blood in  stool, constipation and rectal pain.  Genitourinary: Negative.   Musculoskeletal: Negative.   Skin: Negative.   Neurological:  Positive for weakness (generalized).  All other systems reviewed and are negative.   Physical Exam Updated Vital Signs BP 100/70 (BP Location: Left Arm)   Pulse (!) 102   Temp 98.5 F (36.9 C) (Oral)   Resp 18   Ht 5\' 5"  (1.651 m)   Wt 95.3 kg   LMP 08/12/2023 (Approximate)   SpO2 100%   BMI 34.95 kg/m  Physical Exam Vitals and nursing note reviewed.  Constitutional:      General: She is not in acute distress.    Appearance: She is well-developed. She is not ill-appearing, toxic-appearing or diaphoretic.  HENT:     Head: Atraumatic.  Eyes:     Pupils: Pupils are equal, round, and reactive to light.  Cardiovascular:     Rate and Rhythm: Normal rate.     Pulses: Normal pulses.          Radial pulses are 2+ on the right side and 2+ on the left side.     Heart sounds: Normal heart sounds.  Pulmonary:     Effort: Pulmonary effort is normal. No respiratory distress.     Breath sounds: Normal breath sounds.  Chest:     Comments: Nontender, no crepitus Abdominal:     General: Bowel sounds are normal. There is no distension.  Tenderness: There is generalized abdominal tenderness. There is no right CVA tenderness, left CVA tenderness, guarding or rebound. Negative signs include Murphy's sign.     Hernia: No hernia is present.  Musculoskeletal:        General: Normal range of motion.     Cervical back: Normal range of motion.  Skin:    General: Skin is warm and dry.  Neurological:     General: No focal deficit present.     Mental Status: She is alert.  Psychiatric:        Mood and Affect: Mood normal.     ED Results / Procedures / Treatments   Labs (all labs ordered are listed, but only abnormal results are displayed) Labs Reviewed  COMPREHENSIVE METABOLIC PANEL WITH GFR - Abnormal; Notable for the following components:      Result Value    CO2 20 (*)    Glucose, Bld 101 (*)    All other components within normal limits  CBC - Abnormal; Notable for the following components:   RDW 15.9 (*)    All other components within normal limits  URINALYSIS, ROUTINE W REFLEX MICROSCOPIC - Abnormal; Notable for the following components:   APPearance HAZY (*)    Ketones, ur 80 (*)    All other components within normal limits  RESP PANEL BY RT-PCR (RSV, FLU A&B, COVID)  RVPGX2  LIPASE, BLOOD  HCG, SERUM, QUALITATIVE    EKG EKG Interpretation Date/Time:  Wednesday September 13 2023 16:29:23 EDT Ventricular Rate:  104 PR Interval:  138 QRS Duration:  70 QT Interval:  328 QTC Calculation: 431 R Axis:   71  Text Interpretation: Sinus tachycardia T wave abnormality, consider anterior ischemia Abnormal ECG When compared with ECG of 18-Jul-2022 10:10, PREVIOUS ECG IS PRESENT T wave changes present before Confirmed by Coralee Pesa [id number]) on 09/13/2023 4:37:32 PM  Radiology No results found.  Procedures Procedures    Medications Ordered in ED Medications  ondansetron (ZOFRAN) injection 4 mg (4 mg Intravenous Given 09/13/23 1627)  sodium chloride 0.9 % bolus 1,000 mL (0 mLs Intravenous Stopped 09/13/23 1930)    ED Course/ Medical Decision Making/ A&P    23 year old here for evaluation of N/V/D, family member sick with similar symptoms.  Generalized abdominal tenderness however no focal pain.  No urinary symptoms.  Denies chance of pregnancy.  Heart and lungs clear.  Afebrile, nonseptic, non-ill-appearing plan on labs and reassessment.  Labs and imaging personally viewed and interpreted: Lipase 26 Pregnancy test negative CBC without leukocytosis Metabolic panel without significant abnormality Viral panel neg EKG without ischemic changes  Patient reassessed.  Feels improved.  Still pending UA.  Will p.o. challenge.  Patient reassessed.  Tolerating p.o. intake.  Will DC home.    Patient is nontoxic, nonseptic appearing, in no  apparent distress.  Patient's pain and other symptoms adequately managed in emergency department.  Fluid bolus given.  Labs, imaging and vitals reviewed.  Patient does not meet the SIRS or Sepsis criteria.  On repeat exam patient does not have a surgical abdomin and there are no peritoneal signs.  No indication of appendicitis, bowel obstruction, bowel perforation, cholecystitis, diverticulitis, PID, intermittent/persistent torsion ectopic pregnancy, AAA, dissection, kidney stone, boerhaave, electrolyte abnormality, pneumonia, PE.  Patient discharged home with symptomatic treatment and given strict instructions for follow-up with their primary care physician.  I have also discussed reasons to return immediately to the ER.  Patient expresses understanding and agrees with plan.  Medical Decision Making Amount and/or Complexity of Data Reviewed Independent Historian: friend External Data Reviewed: labs, radiology, ECG and notes. Labs: ordered. Decision-making details documented in ED Course. ECG/medicine tests: ordered and independent interpretation performed. Decision-making details documented in ED Course.  Risk OTC drugs. Prescription drug management. Decision regarding hospitalization. Diagnosis or treatment significantly limited by social determinants of health.         Final Clinical Impression(s) / ED Diagnoses Final diagnoses:  Nausea vomiting and diarrhea    Rx / DC Orders ED Discharge Orders          Ordered    ondansetron (ZOFRAN-ODT) 4 MG disintegrating tablet  Every 8 hours PRN        09/13/23 1937    loperamide (IMODIUM) 2 MG capsule  4 times daily PRN        09/13/23 1937    dicyclomine (BENTYL) 20 MG tablet  2 times daily        09/13/23 1937              Dung Prien A, PA-C 09/13/23 1938    Horton, Clabe Seal, DO 09/14/23 0002
